# Patient Record
Sex: Male | Born: 1995 | Race: Black or African American | Hispanic: No | Marital: Single | Smoking: Never smoker
Health system: Southern US, Community
[De-identification: ages and names within clinical notes are randomized; demographics above are authoritative.]

---

## 2020-09-03 ENCOUNTER — Emergency Department (HOSPITAL_BASED_OUTPATIENT_CLINIC_OR_DEPARTMENT_OTHER)
Admission: EM | Admit: 2020-09-03 | Discharge: 2020-09-03 | Disposition: A | Discharge status: Home / Self Care | Payer: BLUE CROSS/BLUE SHIELD | Attending: Emergency Medicine | Admitting: Emergency Medicine

## 2020-09-03 ENCOUNTER — Other Ambulatory Visit: Payer: Self-pay

## 2020-09-03 ENCOUNTER — Emergency Department (HOSPITAL_BASED_OUTPATIENT_CLINIC_OR_DEPARTMENT_OTHER): Payer: BLUE CROSS/BLUE SHIELD

## 2020-09-03 ENCOUNTER — Encounter (HOSPITAL_BASED_OUTPATIENT_CLINIC_OR_DEPARTMENT_OTHER): Payer: Self-pay

## 2020-09-03 DIAGNOSIS — Y9367 Activity, basketball: Secondary | ICD-10-CM | POA: Insufficient documentation

## 2020-09-03 DIAGNOSIS — S93402A Sprain of unspecified ligament of left ankle, initial encounter: Secondary | ICD-10-CM | POA: Diagnosis not present

## 2020-09-03 DIAGNOSIS — X501XXA Overexertion from prolonged static or awkward postures, initial encounter: Secondary | ICD-10-CM | POA: Insufficient documentation

## 2020-09-03 DIAGNOSIS — S99912A Unspecified injury of left ankle, initial encounter: Secondary | ICD-10-CM | POA: Diagnosis present

## 2020-09-03 DIAGNOSIS — G8911 Acute pain due to trauma: Secondary | ICD-10-CM

## 2020-09-03 MED ORDER — IBUPROFEN 800 MG PO TABS
800.0000 mg | ORAL_TABLET | Freq: Once | ORAL | Status: AC
  Administered 2020-09-03: 800 mg via ORAL
  Filled 2020-09-03: qty 1

## 2020-09-03 MED ORDER — CYCLOBENZAPRINE HCL 10 MG PO TABS: 10.0000 mg | ORAL_TABLET | Freq: Two times a day (BID) | ORAL | 0 refills | Status: DC | PRN

## 2020-09-03 MED ORDER — IBUPROFEN 600 MG PO TABS: 600.0000 mg | ORAL_TABLET | Freq: Four times a day (QID) | ORAL | 0 refills | Status: DC | PRN

## 2020-09-03 NOTE — ED Provider Notes (Signed)
[facility] EMERGENCY DEPARTMENT Provider Note   CSN: [id number] Arrival date & time: 09/03/20  1934     History Chief Complaint  Patient presents with  . Ankle Pain    Kenneth Mendoza is a 25 y.o. male.  The history is provided by the patient. No language interpreter was used.  Ankle Pain Associated symptoms: no fever      25 year old male presenting for evaluation of foot/ankle injury.  Patient report he was playing basketball earlier today when he landed awkwardly and twisted his left ankle.  He felt a pop.  He was having difficulty bearing weight afterward.  Describes sharp throbbing pain about the base of the ankle worse with movement.  He also noticed swelling.  He denies any knee or hip pain.  Denies any numbness  History reviewed. No pertinent past medical history.  There are no problems to display for this patient.   The histories are not reviewed yet. Please review them in the "History" navigator section and refresh this SmartLink.     History reviewed. No pertinent family history.     Home Medications Prior to Admission medications   Not on File    Allergies    Patient has no known allergies.  Review of Systems   Review of Systems  Constitutional: Negative for fever.  Musculoskeletal: Positive for arthralgias and joint swelling.  Skin: Negative for wound.  Neurological: Negative for numbness.    Physical Exam Updated Vital Signs BP 122/78 (BP Location: Right Arm)   Pulse 76   Temp 98.1 F (36.7 C) (Oral)   Resp 16   Ht 5\' 11"  (1.803 m)   Wt 79.4 kg   SpO2 100%   BMI 24.41 kg/m   Physical Exam Vitals and nursing note reviewed.  Constitutional:      General: He is not in acute distress.    Appearance: He is well-developed.  HENT:     Head: Atraumatic.  Eyes:     Conjunctiva/sclera: Conjunctivae normal.  Musculoskeletal:        General: Swelling (Left ankle: Tenderness to the lateral malleoli region with associated edema.  No  obvious deformity or crepitus noted.  DP pulse palpable.  Brisk cap refill.  No pain at fifth metatarsal region.) present.     Cervical back: Neck supple.     Comments: Left knee and left hip nontender.  Skin:    Findings: No rash.  Neurological:     Mental Status: He is alert.  Psychiatric:        Mood and Affect: Mood normal.     ED Results / Procedures / Treatments   Labs (all labs ordered are listed, but only abnormal results are displayed) Labs Reviewed - No data to display  EKG None  Radiology DG Foot Complete Left  Result Date: 09/03/2020 CLINICAL DATA:  Ankle injury while playing basketball. EXAM: LEFT FOOT - COMPLETE 3+ VIEW COMPARISON:  None. FINDINGS: There is no evidence of fracture or dislocation. There is no evidence of arthropathy or other focal bone abnormality. Soft tissues are unremarkable. IMPRESSION: Negative. Electronically Signed   By: 11/03/2020 M.D.   On: 09/03/2020 20:22    Procedures Procedures   Medications Ordered in ED Medications  ibuprofen (ADVIL) tablet 800 mg (has no administration in time range)    ED Course  I have reviewed the triage vital signs and the nursing notes.  Pertinent labs & imaging results that were available during my care of the patient were  reviewed by me and considered in my medical decision making (see chart for details).    MDM Rules/Calculators/A&P                          BP 122/78 (BP Location: Right Arm)   Pulse 76   Temp 98.1 F (36.7 C) (Oral)   Resp 16   Ht 5\' 11"  (1.803 m)   Wt 79.4 kg   SpO2 100%   BMI 24.41 kg/m   Final Clinical Impression(s) / ED Diagnoses Final diagnoses:  Acute pain due to injury  Sprain of left ankle, unspecified ligament, initial encounter    Rx / DC Orders ED Discharge Orders         Ordered    ibuprofen (ADVIL) 600 MG tablet  Every 6 hours PRN        09/03/20 2107    cyclobenzaprine (FLEXERIL) 10 MG tablet  2 times daily PRN        09/03/20 2107         9:06  PM Patient twisted his left ankle while playing basketball today.  He does have swelling to the lateral malleoli area on his left ankle.  X-ray without any acute fracture or dislocation.  Suspect ankle sprain.  ASO brace provided, rice therapy discussed.  Orthopedic referral given, work note provided.   2108, PA-C 09/03/20 2109    2110, MD 09/04/20 [phone]

## 2020-09-03 NOTE — ED Triage Notes (Signed)
PT states he was playing basketball and injured his left ankle. Ambulated into triage. Took ibuprofen approx 1830

## 2021-04-17 ENCOUNTER — Other Ambulatory Visit: Payer: Self-pay

## 2021-04-17 ENCOUNTER — Emergency Department (HOSPITAL_BASED_OUTPATIENT_CLINIC_OR_DEPARTMENT_OTHER): Payer: BLUE CROSS/BLUE SHIELD

## 2021-04-17 ENCOUNTER — Encounter (HOSPITAL_BASED_OUTPATIENT_CLINIC_OR_DEPARTMENT_OTHER): Payer: Self-pay | Admitting: Emergency Medicine

## 2021-04-17 ENCOUNTER — Emergency Department (HOSPITAL_BASED_OUTPATIENT_CLINIC_OR_DEPARTMENT_OTHER)
Admission: EM | Admit: 2021-04-17 | Discharge: 2021-04-17 | Disposition: A | Discharge status: Home / Self Care | Payer: BLUE CROSS/BLUE SHIELD | Attending: Emergency Medicine | Admitting: Emergency Medicine

## 2021-04-17 DIAGNOSIS — S90129A Contusion of unspecified lesser toe(s) without damage to nail, initial encounter: Secondary | ICD-10-CM

## 2021-04-17 DIAGNOSIS — W2209XA Striking against other stationary object, initial encounter: Secondary | ICD-10-CM | POA: Insufficient documentation

## 2021-04-17 DIAGNOSIS — M79675 Pain in left toe(s): Secondary | ICD-10-CM | POA: Diagnosis present

## 2021-04-17 NOTE — ED Provider Notes (Signed)
°  [facility] EMERGENCY DEPT Provider Note   CSN: [id number] Arrival date & time: 04/17/21  1150     History  Chief Complaint  Patient presents with   Toe Pain    De Jaworski is a 26 y.o. male.  26 year old male presents with 3 weeks of left third toe pain after injuring it at home.  States he struck it against a piece of furniture.  Has had pain characterizes throbbing that is worse with ambulation or standing for prolonged periods of time.  Has been using extra socks for support at home      Home Medications Prior to Admission medications   Medication Sig Start Date End Date Taking? Authorizing Provider  cyclobenzaprine (FLEXERIL) 10 MG tablet Take 1 tablet (10 mg total) by mouth 2 (two) times daily as needed for muscle spasms. 09/03/20   Fayrene Helper, PA-C  ibuprofen (ADVIL) 600 MG tablet Take 1 tablet (600 mg total) by mouth every 6 (six) hours as needed. 09/03/20   Fayrene Helper, PA-C      Allergies    Patient has no known allergies.    Review of Systems   Review of Systems  All other systems reviewed and are negative.  Physical Exam Updated Vital Signs BP (!) 146/66 (BP Location: Right Arm)    Pulse 82    Temp 98.1 F (36.7 C) (Oral)    Resp 20    Ht 1.803 m (5\' 11" )    Wt 74.8 kg    SpO2 99%    BMI 23.01 kg/m  Physical Exam Vitals and nursing note reviewed.  Constitutional:      Appearance: He is well-developed. He is not toxic-appearing.  HENT:     Head: Normocephalic and atraumatic.  Eyes:     Conjunctiva/sclera: Conjunctivae normal.     Pupils: Pupils are equal, round, and reactive to light.  Cardiovascular:     Rate and Rhythm: Normal rate.  Pulmonary:     Effort: Pulmonary effort is normal.  Musculoskeletal:     Cervical back: Normal range of motion.  Feet:     Comments: Left third toe is tender to palpation. Skin:    General: Skin is warm and dry.  Neurological:     Mental Status: He is alert and oriented to person, place, and time.     ED Results / Procedures / Treatments   Labs (all labs ordered are listed, but only abnormal results are displayed) Labs Reviewed - No data to display  EKG None  Radiology No results found.  Procedures Procedures    Medications Ordered in ED Medications - No data to display  ED Course/ Medical Decision Making/ A&P                           Medical Decision Making  X-rays are negative.  Patient will buddy tape his toe will be discharged         Final Clinical Impression(s) / ED Diagnoses Final diagnoses:  None    Rx / DC Orders ED Discharge Orders     None         , MD 04/17/21 1242

## 2021-04-17 NOTE — Discharge Instructions (Signed)
Your x-rays are negative here.

## 2021-04-17 NOTE — ED Triage Notes (Signed)
Left third toe x  3 weeks ,  limping since  sat ,   does remember hitting that toe in the kitchen

## 2021-04-17 NOTE — ED Notes (Signed)
Left third toe buddy taped to left 2nd toe without difficulty.

## 2021-04-20 DIAGNOSIS — U071 COVID-19: Secondary | ICD-10-CM

## 2021-04-20 HISTORY — DX: COVID-19: U07.1

## 2021-04-25 ENCOUNTER — Encounter: Payer: Self-pay | Admitting: Family Medicine

## 2021-04-25 ENCOUNTER — Emergency Department (INDEPENDENT_AMBULATORY_CARE_PROVIDER_SITE_OTHER)
Admission: EM | Admit: 2021-04-25 | Discharge: 2021-04-25 | Disposition: A | Discharge status: Home / Self Care | Payer: BLUE CROSS/BLUE SHIELD | Source: Home / Self Care

## 2021-04-25 ENCOUNTER — Other Ambulatory Visit: Payer: Self-pay

## 2021-04-25 DIAGNOSIS — U071 COVID-19: Secondary | ICD-10-CM

## 2021-04-25 DIAGNOSIS — R059 Cough, unspecified: Secondary | ICD-10-CM

## 2021-04-25 MED ORDER — PREDNISONE 20 MG PO TABS: ORAL_TABLET | ORAL | 0 refills | Status: DC

## 2021-04-25 MED ORDER — BENZONATATE 200 MG PO CAPS: 200.0000 mg | ORAL_CAPSULE | Freq: Three times a day (TID) | ORAL | 0 refills | Status: AC | PRN

## 2021-04-25 NOTE — ED Provider Notes (Signed)
[facility]    CSN: [id number] Arrival date & time: 04/25/21  1829      History   Chief Complaint Chief Complaint  Patient presents with   Covid Positive    HPI Kenneth Mendoza is a 26 y.o. male.   HPI 26 year old male presents with COVID-positive  4 days ago on home test.  Patient reports  Past Medical History:  Diagnosis Date   COVID-19 04/20/2021   no vaccine    There are no problems to display for this patient.   History reviewed. No pertinent surgical history.     Home Medications    Prior to Admission medications   Medication Sig Start Date End Date Taking? Authorizing Provider  benzonatate (TESSALON) 200 MG capsule Take 1 capsule (200 mg total) by mouth 3 (three) times daily as needed for up to 7 days for cough. 04/25/21 05/02/21 Yes Trevor Iha, FNP  predniSONE (DELTASONE) 20 MG tablet Take 3 tabs PO daily x 5 days. 04/25/21  Yes Trevor Iha, FNP  cyclobenzaprine (FLEXERIL) 10 MG tablet Take 1 tablet (10 mg total) by mouth 2 (two) times daily as needed for muscle spasms. Patient not taking: Reported on 04/25/2021 09/03/20   Fayrene Helper, PA-C  ibuprofen (ADVIL) 600 MG tablet Take 1 tablet (600 mg total) by mouth every 6 (six) hours as needed. Patient not taking: Reported on 04/25/2021 09/03/20   Fayrene Helper, PA-C    Family History Family History  Problem Relation Age of Onset   Hypertension Mother     Social History Social History   Tobacco Use   Smoking status: Never    Passive exposure: Never   Smokeless tobacco: Never  Vaping Use   Vaping Use: Never used  Substance Use Topics   Alcohol use: Never   Drug use: Not Currently    Types: Marijuana     Allergies   Patient has no known allergies.   Review of Systems Review of Systems  HENT:  Positive for congestion.   Respiratory:  Positive for cough.   All other systems reviewed and are negative.   Physical Exam Triage Vital Signs ED Triage Vitals  Enc Vitals Group     BP       Pulse      Resp      Temp      Temp src      SpO2      Weight      Height      Head Circumference      Peak Flow      Pain Score      Pain Loc      Pain Edu?      Excl. in GC?    No data found.  Updated Vital Signs BP 131/73 (BP Location: Right Arm)    Pulse 74    Temp 98.8 F (37.1 C) (Oral)    Resp 15    SpO2 98%      Physical Exam Vitals and nursing note reviewed.  Constitutional:      Appearance: Normal appearance. He is normal weight.  HENT:     Head: Normocephalic and atraumatic.     Right Ear: Tympanic membrane, ear canal and external ear normal.     Left Ear: Tympanic membrane, ear canal and external ear normal.     Mouth/Throat:     Mouth: Mucous membranes are moist.     Pharynx: Oropharynx is clear.  Eyes:     Extraocular Movements: Extraocular  movements intact.     Conjunctiva/sclera: Conjunctivae normal.     Pupils: Pupils are equal, round, and reactive to light.  Cardiovascular:     Rate and Rhythm: Normal rate and regular rhythm.     Pulses: Normal pulses.     Heart sounds: Normal heart sounds.  Pulmonary:     Effort: Pulmonary effort is normal.     Breath sounds: Normal breath sounds.  Musculoskeletal:     Cervical back: Normal range of motion and neck supple.  Skin:    General: Skin is warm and dry.  Neurological:     General: No focal deficit present.     Mental Status: He is alert and oriented to person, place, and time.     UC Treatments / Results  Labs (all labs ordered are listed, but only abnormal results are displayed) Labs Reviewed - No data to display  EKG   Radiology No results found.  Procedures Procedures (including critical care time)  Medications Ordered in UC Medications - No data to display  Initial Impression / Assessment and Plan / UC Course  I have reviewed the triage vital signs and the nursing notes.  Pertinent labs & imaging results that were available during my care of the patient were reviewed by me  and considered in my medical decision making (see chart for details).     MDM: 1.  COVID-19-Advised/instructed patient to self quarantine for 10 days or from initial test date of Friday, 04/21/2021-Monday, 05/01/2021.  Advised patient if asymptomatic and afebrile may discontinue self quarantine date 5 days after testing or Wednesday, 04/26/2021.  Advised patient may alternate OTC Tylenol 1000 mg 1-2 times daily, as needed with OTC Ibuprofen 800 mg 1-2 times daily, as needed for fever and myalgias. 2. Cough-Rx'd Prednisone and Tessalon Perles. Advised patient to take medication as directed with food to completion.  Advised patient may use Tessalon Perles daily or as needed for cough.  Patient discharged home, hemodynamically stable. Final Clinical Impressions(s) / UC Diagnoses   Final diagnoses:  COVID-19  Cough, unspecified type     Discharge Instructions      Advised/instructed patient to self quarantine for 10 days or from initial test date of Thursday, 04/20/2021-Sunday, 04/30/2021.  Advised patient if asymptomatic and afebrile may discontinue self quarantine date 5 days after testing or Tuesday, 04/25/2021.  Advised patient may alternate OTC Tylenol 1000 mg 1-2 times daily, as needed with OTC Ibuprofen 800 mg 1-2 times daily, as needed for fever and myalgias.  Advised patient to take medication as directed with food to completion.  Advised patient may use Tessalon Perles daily or as needed for cough.     ED Prescriptions     Medication Sig Dispense Auth. Provider   predniSONE (DELTASONE) 20 MG tablet Take 3 tabs PO daily x 5 days. 15 tablet Trevor Iha, FNP   benzonatate (TESSALON) 200 MG capsule Take 1 capsule (200 mg total) by mouth 3 (three) times daily as needed for up to 7 days for cough. 40 capsule Trevor Iha, FNP      PDMP not reviewed this encounter.   Trevor Iha, FNP 04/25/21 1918

## 2021-04-25 NOTE — ED Triage Notes (Signed)
COVID positive  Symptoms started last Thursday  Pt continues to have body aches & cough  No OTC meds

## 2021-04-25 NOTE — Discharge Instructions (Addendum)
Advised/instructed patient to self quarantine for 10 days or from initial test date of Thursday, 04/20/2021-Sunday, 04/30/2021.  Advised patient if asymptomatic and afebrile may discontinue self quarantine date 5 days after testing or Tuesday, 04/25/2021.  Advised patient may alternate OTC Tylenol 1000 mg 1-2 times daily, as needed with OTC Ibuprofen 800 mg 1-2 times daily, as needed for fever and myalgias.  Advised patient to take medication as directed with food to completion.  Advised patient may use Tessalon Perles daily or as needed for cough.

## 2021-11-21 ENCOUNTER — Emergency Department (HOSPITAL_BASED_OUTPATIENT_CLINIC_OR_DEPARTMENT_OTHER): Payer: BLUE CROSS/BLUE SHIELD | Admitting: Radiology

## 2021-11-21 ENCOUNTER — Other Ambulatory Visit: Payer: Self-pay

## 2021-11-21 ENCOUNTER — Encounter (HOSPITAL_BASED_OUTPATIENT_CLINIC_OR_DEPARTMENT_OTHER): Payer: Self-pay | Admitting: Pharmacy Technician

## 2021-11-21 ENCOUNTER — Emergency Department (HOSPITAL_BASED_OUTPATIENT_CLINIC_OR_DEPARTMENT_OTHER)
Admission: EM | Admit: 2021-11-21 | Discharge: 2021-11-21 | Disposition: A | Discharge status: Home / Self Care | Payer: BLUE CROSS/BLUE SHIELD | Attending: Emergency Medicine | Admitting: Emergency Medicine

## 2021-11-21 ENCOUNTER — Other Ambulatory Visit (HOSPITAL_BASED_OUTPATIENT_CLINIC_OR_DEPARTMENT_OTHER): Payer: Self-pay

## 2021-11-21 DIAGNOSIS — R0789 Other chest pain: Secondary | ICD-10-CM | POA: Diagnosis not present

## 2021-11-21 DIAGNOSIS — F419 Anxiety disorder, unspecified: Secondary | ICD-10-CM | POA: Diagnosis not present

## 2021-11-21 DIAGNOSIS — Z8616 Personal history of COVID-19: Secondary | ICD-10-CM | POA: Insufficient documentation

## 2021-11-21 DIAGNOSIS — R079 Chest pain, unspecified: Secondary | ICD-10-CM

## 2021-11-21 LAB — CBC
HCT: 43.1 % (ref 39.0–52.0)
Hemoglobin: 14.5 g/dL (ref 13.0–17.0)
MCH: 30 pg (ref 26.0–34.0)
MCHC: 33.6 g/dL (ref 30.0–36.0)
MCV: 89.2 fL (ref 80.0–100.0)
Platelets: 214 10*3/uL (ref 150–400)
RBC: 4.83 MIL/uL (ref 4.22–5.81)
RDW: 12.4 % (ref 11.5–15.5)
WBC: 6 10*3/uL (ref 4.0–10.5)
nRBC: 0 % (ref 0.0–0.2)

## 2021-11-21 LAB — BASIC METABOLIC PANEL
Anion gap: 12 (ref 5–15)
BUN: 17 mg/dL (ref 6–20)
CO2: 23 mmol/L (ref 22–32)
Calcium: 10 mg/dL (ref 8.9–10.3)
Chloride: 100 mmol/L (ref 98–111)
Creatinine, Ser: 1.14 mg/dL (ref 0.61–1.24)
GFR, Estimated: >60 mL/min (ref >60)
Glucose, Bld: 79 mg/dL (ref 70–99)
Potassium: 3.9 mmol/L (ref 3.5–5.1)
Sodium: 135 mmol/L (ref 135–145)

## 2021-11-21 LAB — TROPONIN I (HIGH SENSITIVITY): Troponin I (High Sensitivity): 7 ng/L (ref <18)

## 2021-11-21 MED ORDER — HYDROXYZINE HCL 25 MG PO TABS
25.0000 mg | ORAL_TABLET | Freq: Three times a day (TID) | ORAL | 0 refills | Status: DC | PRN
  Filled 2021-11-21: qty 8, 3d supply, fill #0

## 2021-11-21 NOTE — ED Triage Notes (Signed)
Pt here with reports of chest pain X2 days. L sided, no radiation. States today he had a panic attack as well. Denies shob, nausea or diaphoresis.

## 2021-11-21 NOTE — ED Provider Notes (Signed)
[facility] EMERGENCY DEPT Provider Note   CSN: [id number] Arrival date & time: 11/21/21  1112     History  No chief complaint on file.   Kenneth Mendoza is a 26 y.o. male.  HPI Patient presents with chest pain and palpitations.  Began today acutely while at work.  States began to feel anxious.  Has had anxiety with a 2 and feels he could have had a panic attack.  States he was not short of breath but did feel as chest hurting on the left side.  Feeling somewhat better now.  No family history of early cardiac disease.  Denies drug use.  No fevers or chills.  No coughing.   Past Medical History:  Diagnosis Date   COVID-19 04/20/2021   no vaccine    Home Medications Prior to Admission medications   Medication Sig Start Date End Date Taking? Authorizing Provider  hydrOXYzine (ATARAX) 25 MG tablet Take 1 tablet (25 mg total) by mouth every 8 (eight) hours as needed for anxiety. 11/21/21  Yes Benjiman Core, MD  cyclobenzaprine (FLEXERIL) 10 MG tablet Take 1 tablet (10 mg total) by mouth 2 (two) times daily as needed for muscle spasms. Patient not taking: Reported on 04/25/2021 09/03/20   Fayrene Helper, PA-C  ibuprofen (ADVIL) 600 MG tablet Take 1 tablet (600 mg total) by mouth every 6 (six) hours as needed. Patient not taking: Reported on 04/25/2021 09/03/20   Fayrene Helper, PA-C  predniSONE (DELTASONE) 20 MG tablet Take 3 tabs PO daily x 5 days. 04/25/21   Trevor Iha, FNP      Allergies    Patient has no known allergies.    Review of Systems   Review of Systems  Physical Exam Updated Vital Signs BP 136/71   Pulse 74   Temp 98.3 F (36.8 C)   Resp 17   SpO2 99%  Physical Exam Vitals and nursing note reviewed.  HENT:     Head: Normocephalic.  Eyes:     Pupils: Pupils are equal, round, and reactive to light.  Cardiovascular:     Rate and Rhythm: Regular rhythm.  Pulmonary:     Comments: Left anterior upper chest wall tenderness. Chest:     Chest wall:  Tenderness present.  Abdominal:     Tenderness: There is no abdominal tenderness.  Musculoskeletal:        General: No tenderness.  Skin:    Capillary Refill: Capillary refill takes less than 2 seconds.  Neurological:     Mental Status: He is alert and oriented to person, place, and time.     ED Results / Procedures / Treatments   Labs (all labs ordered are listed, but only abnormal results are displayed) Labs Reviewed  BASIC METABOLIC PANEL  CBC  TROPONIN I (HIGH SENSITIVITY)  TROPONIN I (HIGH SENSITIVITY)    EKG EKG Interpretation  Date/Time:  Tuesday November 21 2021 11:23:43 EDT Ventricular Rate:  76 PR Interval:  158 QRS Duration: 82 QT Interval:  360 QTC Calculation: 405 R Axis:   73 Text Interpretation: Normal sinus rhythm Normal ECG No previous ECGs available Confirmed by Benjiman Core [phone]) on 11/21/2021 1:13:13 PM  Radiology DG Chest 2 View  Result Date: 11/21/2021 CLINICAL DATA:  Chest pain, shortness of breath EXAM: CHEST - 2 VIEW COMPARISON:  None Available. FINDINGS: The heart size and mediastinal contours are within normal limits. Both lungs are clear. The visualized skeletal structures are unremarkable. IMPRESSION: Normal chest radiographs. Electronically Signed   By: Janyth Pupa  Plundo D.O.   On: 11/21/2021 11:46    Procedures Procedures    Medications Ordered in ED Medications - No data to display  ED Course/ Medical Decision Making/ A&P                           Medical Decision Making Amount and/or Complexity of Data Reviewed Labs: ordered. Radiology: ordered.  Risk Prescription drug management.   Patient feels with left-sided chest pain and some anxiety.  Has had chest pain for the last couple days.  No fevers.  No cough.  No shortness of breath.  Differential diagnosis includes coronary artery disease, pneumonia, pulmonary embolism, anxiety. Chest wall pain also considered and is reproducible left-sided upper chest wall.  EKG  reassuring and troponin negative.  Chest x-ray also shows no acute cause of the pain.  Doubt severe cause at this time.  Potentially could have had a arrhythmia that resolved but EKG reassuring.  Appears stable for discharge home.  Likely does have a component anxiety and will give prescription for Vistaril.  Outpatient follow-up as needed.  Will discharge.        Final Clinical Impression(s) / ED Diagnoses Final diagnoses:  Nonspecific chest pain  Anxiety    Rx / DC Orders ED Discharge Orders          Ordered    hydrOXYzine (ATARAX) 25 MG tablet  Every 8 hours PRN        11/21/21 1320              Benjiman Core, MD 11/21/21 1324

## 2021-12-05 ENCOUNTER — Other Ambulatory Visit (HOSPITAL_BASED_OUTPATIENT_CLINIC_OR_DEPARTMENT_OTHER): Payer: Self-pay

## 2021-12-28 ENCOUNTER — Ambulatory Visit (INDEPENDENT_AMBULATORY_CARE_PROVIDER_SITE_OTHER): Payer: BLUE CROSS/BLUE SHIELD | Admitting: Nurse Practitioner

## 2021-12-28 ENCOUNTER — Encounter (HOSPITAL_BASED_OUTPATIENT_CLINIC_OR_DEPARTMENT_OTHER): Payer: Self-pay | Admitting: Nurse Practitioner

## 2021-12-28 VITALS — BP 113/90 | HR 77 | Ht 72.0 in | Wt 162.0 lb

## 2021-12-28 DIAGNOSIS — F411 Generalized anxiety disorder: Secondary | ICD-10-CM

## 2021-12-28 DIAGNOSIS — Z0000 Encounter for general adult medical examination without abnormal findings: Secondary | ICD-10-CM

## 2021-12-28 MED ORDER — HYDROXYZINE HCL 25 MG PO TABS: 25.0000 mg | ORAL_TABLET | Freq: Three times a day (TID) | ORAL | 6 refills | Status: AC | PRN

## 2021-12-28 NOTE — Assessment & Plan Note (Signed)
New onset of anxiety well controlled at this time. Recent panic attack, cleared by ED. Hydroxyzine helpful for periods of stress, will refill today. Recommend continue with deep breathing, relaxation, and walks to help. F/U if symptoms worsen

## 2021-12-28 NOTE — Assessment & Plan Note (Signed)

## 2021-12-28 NOTE — Patient Instructions (Addendum)
Thank you for choosing [employer] at [facility] for your Primary Care needs. I am excited for the opportunity to partner with you to meet your health care goals. It was a pleasure meeting you today!  Recommendations from today's visit: Remember: Welda! :-) We will plan to get a few labs today for baseline. I will be in touch with you once I have reviewed these. You will see them immediately, but it may take me a few days to get them and review them.  Your exam looked great today!  If you feel like your anxiety is worsening or getting harder to control, please let me know.   Information on diet, exercise, and health maintenance recommendations are listed below. This is information to help you be sure you are on track for optimal health and monitoring.   Please look over this and let us know if you have any questions or if you have completed any of the health maintenance outside of [place] so that we can be sure your records are up to date.  ___________________________________________________________ About Me: I am an Adult-Geriatric Nurse Practitioner with a background in caring for patients for more than 20 years with a strong intensive care background. I provide primary care and sports medicine services to patients age 50 and older within this office. My education had a strong focus on caring for the older adult population, which I am passionate about. I am also the director of the APP Fellowship with [facility].   My desire is to provide you with the best service through preventive medicine and supportive care. I consider you a part of the medical team and value your input. I work diligently to ensure that you are heard and your needs are met in a safe and effective manner. I want you to feel comfortable with me as your provider and want you to know that your health concerns are important to me.  For your information, our office hours are: Monday,  Tuesday, and Thursday 8:00 AM - 5:00 PM Wednesday and Friday 8:00 AM - 12:00 PM.   In my time away from the office I am teaching new APP's within the system and am unavailable, but my partner, Dr. Burnard Bunting is in the office for emergent needs.   If you have questions or concerns, please call our office at [phone] or send [place] a MyChart message and we will respond as quickly as possible.  ____________________________________________________________ MyChart:  For all urgent or time sensitive needs we ask that you please call the office to avoid delays. Our number is [phone]. MyChart is not constantly monitored and due to the large volume of messages a day, replies may take up to 72 business hours.  MyChart Policy: MyChart allows for you to see your visit notes, after visit summary, provider recommendations, lab and tests results, make an appointment, request refills, and contact your provider or the office for non-urgent questions or concerns. Providers are seeing patients during normal business hours and do not have built in time to review MyChart messages.  We ask that you allow a minimum of 3 business days for responses to [employer]. For this reason, please do not send urgent requests through [place]. Please call the office at [phone]. New and ongoing conditions may require a visit. We have virtual and in person visit available for your convenience.  Complex MyChart concerns may require a visit. Your provider may request you schedule a virtual or  in person visit to ensure we are providing the best care possible. MyChart messages sent after 11:00 AM on Friday will not be received by the provider until Monday morning.    Lab and Test Results: You will receive your lab and test results on MyChart as soon as they are completed and results have been sent by the lab or testing facility. Due to this service, you will receive your results BEFORE your provider.  I review lab and tests  results each morning prior to seeing patients. Some results require collaboration with other providers to ensure you are receiving the most appropriate care. For this reason, we ask that you please allow a minimum of 3-5 business days from the time the ALL results have been received for your provider to receive and review lab and test results and contact you about these.  Most lab and test result comments from the provider will be sent through [place]. Your provider may recommend changes to the plan of care, follow-up visits, repeat testing, ask questions, or request an office visit to discuss these results. You may reply directly to this message or call the office at [phone] to provide information for the provider or set up an appointment. In some instances, you will be called with test results and recommendations. Please let us know if this is preferred and we will make note of this in your chart to provide this for you.    If you have not heard a response to your lab or test results in 5 business days from all results returning to [place], please call the office to let us know. We ask that you please avoid calling prior to this time unless there is an emergent concern. Due to high call volumes, this can delay the resulting process.  After Hours: For all non-emergency after hours needs, please call the office at [phone] and select the option to reach the on-call provider service. On-call services are shared between multiple [place] offices and therefore it will not be possible to speak directly with your provider. On-call providers may provide medical advice and recommendations, but are unable to provide refills for maintenance medications.  For all emergency or urgent medical needs after normal business hours, we recommend that you seek care at the closest Urgent Care or Emergency Department to ensure appropriate treatment in a timely manner.  [facility] [place] has a 24 hour  emergency room located on the ground floor for your convenience.   Urgent Concerns During the Business Day Providers are seeing patients from 8AM to [place] with a busy schedule and are most often not able to respond to non-urgent calls until the end of the day or the next business day. If you should have URGENT concerns during the day, please call and speak to the nurse or schedule a same day appointment so that we can address your concern without delay.   Thank you, again, for choosing me as your health care partner. I appreciate your trust and look forward to learning more about you.   Worthy Keeler, DNP, AGNP-c ___________________________________________________________  Health Maintenance Recommendations Screening Testing Mammogram Every 1 -2 years based on history and risk factors Starting at age 71 Pap Smear Ages 21-39 every 3 years Ages 5-65 every 5 years with HPV testing More frequent testing may be required based on results and history Colon Cancer Screening Every 1-10 years based on test performed, risk factors, and history Starting at age 88 Bone Density Screening Every 2-10  years based on history Starting at age 50 for women Recommendations for men differ based on medication usage, history, and risk factors AAA Screening One time ultrasound Men 36-18 years old who have every smoked Lung Cancer Screening Low Dose Lung CT every 12 months Age 6-80 years with a 30 pack-year smoking history who still smoke or who have quit within the last 15 years  Screening Labs Routine  Labs: Complete Blood Count (CBC), Complete Metabolic Panel (CMP), Cholesterol (Lipid Panel) Every 6-12 months based on history and medications May be recommended more frequently based on current conditions or previous results Hemoglobin A1c Lab Every 3-12 months based on history and previous results Starting at age 43 or earlier with diagnosis of diabetes, high cholesterol, BMI >26, and/or risk  factors Frequent monitoring for patients with diabetes to ensure blood sugar control Thyroid Panel (TSH w/ T3 & T4) Every 6 months based on history, symptoms, and risk factors May be repeated more often if on medication HIV One time testing for all patients 94 and older May be repeated more frequently for patients with increased risk factors or exposure Hepatitis C One time testing for all patients 4 and older May be repeated more frequently for patients with increased risk factors or exposure Gonorrhea, Chlamydia Every 12 months for all sexually active persons 13-24 years Additional monitoring may be recommended for those who are considered high risk or who have symptoms PSA Men 7-66 years old with risk factors Additional screening may be recommended from age 27-69 based on risk factors, symptoms, and history  Vaccine Recommendations Tetanus Booster All adults every 10 years Flu Vaccine All patients 6 months and older every year COVID Vaccine All patients 12 years and older Initial dosing with booster May recommend additional booster based on age and health history HPV Vaccine 2 doses all patients age 23-26 Dosing may be considered for patients over 26 Shingles Vaccine (Shingrix) 2 doses all adults 36 years and older Pneumonia (Pneumovax 23) All adults 24 years and older May recommend earlier dosing based on health history Pneumonia (Prevnar 65) All adults 28 years and older Dosed 1 year after Pneumovax 23  Additional Screening, Testing, and Vaccinations may be recommended on an individualized basis based on family history, health history, risk factors, and/or exposure.  __________________________________________________________  Diet Recommendations for All Patients  I recommend that all patients maintain a diet low in saturated fats, carbohydrates, and cholesterol. While this can be challenging at first, it is not impossible and small changes can make big differences.   Things to try: Decreasing the amount of soda, sweet tea, and/or juice to one or less per day and replace with water While water is always the first choice, if you do not like water you may consider adding a water additive without sugar to improve the taste other sugar free drinks Replace potatoes with a brightly colored vegetable at dinner Use healthy oils, such as canola oil or olive oil, instead of butter or hard margarine Limit your bread intake to two pieces or less a day Replace regular pasta with low carb pasta options Bake, broil, or grill foods instead of frying Monitor portion sizes  Eat smaller, more frequent meals throughout the day instead of large meals  An important thing to remember is, if you love foods that are not great for your health, you don't have to give them up completely. Instead, allow these foods to be a reward when you have done well. Allowing yourself to still have special  treats every once in a while is a nice way to tell yourself thank you for working hard to keep yourself healthy.   Also remember that every day is a new day. If you have a bad day and "fall off the wagon", you can still climb right back up and keep moving along on your journey!  We have resources available to help you!  Some websites that may be helpful include: www.http://carter.biz/  Www.VeryWellFit.com _____________________________________________________________  Activity Recommendations for All Patients  I recommend that all adults get at least 20 minutes of moderate physical activity that elevates your heart rate at least 5 days out of the week.  Some examples include: Walking or jogging at a pace that allows you to carry on a conversation Cycling (stationary bike or outdoors) Water aerobics Yoga Weight lifting Dancing If physical limitations prevent you from putting stress on your joints, exercise in a pool or seated in a chair are excellent options.  Do determine your MAXIMUM heart  rate for activity: YOUR AGE - 220 = MAX HeartRate   Remember! Do not push yourself too hard.  Start slowly and build up your pace, speed, weight, time in exercise, etc.  Allow your body to rest between exercise and get good sleep. You will need more water than normal when you are exerting yourself. Do not wait until you are thirsty to drink. Drink with a purpose of getting in at least 8, 8 ounce glasses of water a day plus more depending on how much you exercise and sweat.    If you begin to develop dizziness, chest pain, abdominal pain, jaw pain, shortness of breath, headache, vision changes, lightheadedness, or other concerning symptoms, stop the activity and allow your body to rest. If your symptoms are severe, seek emergency evaluation immediately. If your symptoms are concerning, but not severe, please let us know so that we can recommend further evaluation.

## 2021-12-28 NOTE — Progress Notes (Signed)
Kenneth Render, DNP, AGNP-c Primary Care & Sports Medicine [address]  [place], [place] [address] [phone]  New patient visit   Patient: Kenneth Mendoza   DOB: 1995/06/15   26 y.o. Male  MRN: [id number] Visit Date: 12/28/2021  Patient Care Team: Patient, No Pcp Per as PCP - General (General Practice)  Today's Vitals   12/28/21 0820  BP: (!) 113/90  Pulse: 77  SpO2: 99%  Weight: 162 lb (73.5 kg)  Height: 6' (1.829 m)   Body mass index is 21.97 kg/m.   Today's healthcare provider: Orma Render, NP   Chief Complaint  Patient presents with   New Patient (Initial Visit)    Patient presents today to establish care.    Subjective    Kenneth Mendoza is a 26 y.o. male who presents today as a new patient to establish care.    Patient endorses the following concerns presently: No Concerns- he would like a check-up  Physical: basketball and walking at work Works at [employer])  Diet: Low sugar diet, drinking more water, keeping red meat intake low Sexually active with 1 partner. No concerns about STI- testing declined.   Anxiety - seen in ED recently for CP- all tests negative - determined to be panic attack - feels anxious about general things such as bills, work, etc - has been using hydroxyzine and that helped. - also taking walks and working on relaxation techniques, which are also helping.   No ShOB, CP, Dizziness, HA, hearing/vision changes, abdominal pain, bowel/bladder changes, LE edema, fullness in throat  History reviewed and reveals the following: Past Medical History:  Diagnosis Date   COVID-19 04/20/2021   no vaccine   History reviewed. No pertinent surgical history. Family Status  Relation Name Status   Mother  Alive   Father  Alive   Family History  Problem Relation Age of Onset   Hypertension Mother    Social History   Socioeconomic History   Marital status: Single    Spouse name: Not on file    Number of children: Not on file   Years of education: Not on file   Highest education level: Not on file  Occupational History   Not on file  Tobacco Use   Smoking status: Never    Passive exposure: Never   Smokeless tobacco: Never  Vaping Use   Vaping Use: Never used  Substance and Sexual Activity   Alcohol use: Never   Drug use: Not Currently    Types: Marijuana   Sexual activity: Not on file  Other Topics Concern   Not on file  Social History Narrative   Not on file   Social Determinants of Health   Financial Resource Strain: Not on file  Food Insecurity: Not on file  Transportation Needs: Not on file  Physical Activity: Not on file  Stress: Not on file  Social Connections: Not on file   Outpatient Medications Prior to Visit  Medication Sig   [DISCONTINUED] cyclobenzaprine (FLEXERIL) 10 MG tablet Take 1 tablet (10 mg total) by mouth 2 (two) times daily as needed for muscle spasms. (Patient not taking: Reported on 04/25/2021)   [DISCONTINUED] hydrOXYzine (ATARAX) 25 MG tablet Take 1 tablet (25 mg total) by mouth every 8 (eight) hours as needed for anxiety. (Patient not taking: Reported on 12/28/2021)   [DISCONTINUED] ibuprofen (ADVIL) 600 MG tablet Take 1 tablet (600 mg total) by mouth every 6 (six) hours as needed. (Patient not taking:  Reported on 04/25/2021)   [DISCONTINUED] predniSONE (DELTASONE) 20 MG tablet Take 3 tabs PO daily x 5 days. (Patient not taking: Reported on 12/28/2021)   No facility-administered medications prior to visit.   No Known Allergies  There is no immunization history on file for this patient.  Review of Systems All review of systems negative except what is listed in the HPI   Objective    BP (!) 113/90   Pulse 77   Ht 6' (1.829 m)   Wt 162 lb (73.5 kg)   SpO2 99%   BMI 21.97 kg/m  Physical Exam Vitals and nursing note reviewed.  Constitutional:      General: He is not in acute distress.    Appearance: Normal appearance.  HENT:      Head: Normocephalic and atraumatic.     Right Ear: Hearing, tympanic membrane, ear canal and external ear normal.     Left Ear: Hearing, tympanic membrane, ear canal and external ear normal.     Nose: Nose normal.     Right Sinus: No maxillary sinus tenderness or frontal sinus tenderness.     Left Sinus: No maxillary sinus tenderness or frontal sinus tenderness.     Mouth/Throat:     Lips: Pink.     Mouth: Mucous membranes are moist.     Pharynx: Oropharynx is clear.  Eyes:     General: Lids are normal. Vision grossly intact.     Extraocular Movements: Extraocular movements intact.     Conjunctiva/sclera: Conjunctivae normal.     Pupils: Pupils are equal, round, and reactive to light.     Funduscopic exam:    Right eye: No hemorrhage. Red reflex present.        Left eye: No hemorrhage. Red reflex present.    Visual Fields: Right eye visual fields normal and left eye visual fields normal.  Neck:     Thyroid: No thyromegaly.     Vascular: No carotid bruit or JVD.  Cardiovascular:     Rate and Rhythm: Normal rate and regular rhythm.     Chest Wall: PMI is not displaced.     Pulses: Normal pulses.          Dorsalis pedis pulses are 2+ on the right side and 2+ on the left side.       Posterior tibial pulses are 2+ on the right side and 2+ on the left side.     Heart sounds: Normal heart sounds. No murmur heard. Pulmonary:     Effort: Pulmonary effort is normal. No respiratory distress.     Breath sounds: Normal breath sounds.  Chest:  Breasts:    Breasts are symmetrical.  Abdominal:     General: Bowel sounds are normal. There is no distension or abdominal bruit.     Palpations: Abdomen is soft. There is no hepatomegaly, splenomegaly or mass.     Tenderness: There is no abdominal tenderness. There is no right CVA tenderness, left CVA tenderness, guarding or rebound.  Musculoskeletal:        General: Normal range of motion.     Cervical back: Full passive range of motion  without pain and neck supple. No tenderness. No spinous process tenderness or muscular tenderness.     Right lower leg: No edema.     Left lower leg: No edema.  Feet:     Right foot:     Toenail Condition: Right toenails are normal.     Left foot:  Toenail Condition: Left toenails are normal.  Lymphadenopathy:     Cervical: No cervical adenopathy.     Upper Body:     Right upper body: No supraclavicular adenopathy.     Left upper body: No supraclavicular adenopathy.  Skin:    General: Skin is warm and dry.     Capillary Refill: Capillary refill takes less than 2 seconds.     Nails: There is no clubbing.  Neurological:     General: No focal deficit present.     Mental Status: He is alert and oriented to person, place, and time.     Cranial Nerves: No cranial nerve deficit.     Sensory: Sensation is intact. No sensory deficit.     Motor: Motor function is intact. No weakness.     Coordination: Coordination is intact. Coordination normal.     Gait: Gait is intact. Gait normal.  Psychiatric:        Attention and Perception: Attention normal.        Mood and Affect: Mood normal.        Speech: Speech normal.        Behavior: Behavior normal. Behavior is cooperative.        Thought Content: Thought content normal.        Cognition and Memory: Cognition and memory normal.        Judgment: Judgment normal.     No results found for any visits on 12/28/21.  Assessment & Plan      Problem List Items Addressed This Visit     Encounter for medical examination to establish care - Primary    CPE today with no abnormalities noted on exam.  Labs pending. Will make changes as necessary based on results.  Review of HM activities and recommendations discussed and provided on AVS Anticipatory guidance, diet, and exercise recommendations provided.  Medications, allergies, and hx reviewed and updated as necessary.  Plan to f/u with CPE in 1 year or sooner for acute/chronic health needs as  directed.        Generalized anxiety disorder    New onset of anxiety well controlled at this time. Recent panic attack, cleared by ED. Hydroxyzine helpful for periods of stress, will refill today. Recommend continue with deep breathing, relaxation, and walks to help. F/U if symptoms worsen       Relevant Medications   hydrOXYzine (ATARAX) 25 MG tablet   Other Visit Diagnoses     Health care maintenance       Relevant Orders   CBC With Diff/Platelet   Comprehensive metabolic panel   Hemoglobin A1c   Lipid panel        Return in about 1 year (around 12/29/2022) for CPE today- CPE in 1 year.      Gunda Maqueda, Coralee Pesa, NP, DNP, AGNP-C Primary Care & Sports Medicine at [place] Maintenance Due Health Maintenance Topics with due status: Overdue     Topic Date Due   COVID-19 Vaccine Never done   HPV VACCINES Never done   TETANUS/TDAP Never done    CPE Due  Labs Due

## 2021-12-29 LAB — HEMOGLOBIN A1C
Est. average glucose Bld gHb Est-mCnc: 117 mg/dL
Hgb A1c MFr Bld: 5.7 % — ABNORMAL HIGH (ref 4.8–5.6)

## 2021-12-29 LAB — LIPID PANEL
Chol/HDL Ratio: 3.1 ratio (ref 0.0–5.0)
Cholesterol, Total: 173 mg/dL (ref 100–199)
HDL: 56 mg/dL (ref >39)
LDL Chol Calc (NIH): 106 mg/dL — ABNORMAL HIGH (ref 0–99)
Triglycerides: 57 mg/dL (ref 0–149)
VLDL Cholesterol Cal: 11 mg/dL (ref 5–40)

## 2021-12-29 LAB — CBC WITH DIFF/PLATELET
Basophils Absolute: 0 10*3/uL (ref 0.0–0.2)
Basos: 1 %
EOS (ABSOLUTE): 0.1 10*3/uL (ref 0.0–0.4)
Eos: 2 %
Hematocrit: 42.8 % (ref 37.5–51.0)
Hemoglobin: 15.1 g/dL (ref 13.0–17.7)
Immature Grans (Abs): 0 10*3/uL (ref 0.0–0.1)
Immature Granulocytes: 0 %
Lymphocytes Absolute: 1.7 10*3/uL (ref 0.7–3.1)
Lymphs: 42 %
MCH: 30.4 pg (ref 26.6–33.0)
MCHC: 35.3 g/dL (ref 31.5–35.7)
MCV: 86 fL (ref 79–97)
Monocytes Absolute: 0.4 10*3/uL (ref 0.1–0.9)
Monocytes: 10 %
Neutrophils Absolute: 1.9 10*3/uL (ref 1.4–7.0)
Neutrophils: 45 %
Platelets: 236 10*3/uL (ref 150–450)
RBC: 4.96 x10E6/uL (ref 4.14–5.80)
RDW: 11.5 % — ABNORMAL LOW (ref 11.6–15.4)
WBC: 4.1 10*3/uL (ref 3.4–10.8)

## 2021-12-29 LAB — COMPREHENSIVE METABOLIC PANEL
ALT: 10 IU/L (ref 0–44)
AST: 15 IU/L (ref 0–40)
Albumin/Globulin Ratio: 1.8 (ref 1.2–2.2)
Albumin: 5 g/dL (ref 4.3–5.2)
Alkaline Phosphatase: 56 IU/L (ref 44–121)
BUN/Creatinine Ratio: 13 (ref 9–20)
BUN: 14 mg/dL (ref 6–20)
Bilirubin Total: 0.6 mg/dL (ref 0.0–1.2)
CO2: 23 mmol/L (ref 20–29)
Calcium: 10.2 mg/dL (ref 8.7–10.2)
Chloride: 99 mmol/L (ref 96–106)
Creatinine, Ser: 1.07 mg/dL (ref 0.76–1.27)
Globulin, Total: 2.8 g/dL (ref 1.5–4.5)
Glucose: 80 mg/dL (ref 70–99)
Potassium: 4.6 mmol/L (ref 3.5–5.2)
Sodium: 138 mmol/L (ref 134–144)
Total Protein: 7.8 g/dL (ref 6.0–8.5)
eGFR: 99 mL/min/{1.73_m2} (ref >59)

## 2022-03-03 ENCOUNTER — Encounter (HOSPITAL_BASED_OUTPATIENT_CLINIC_OR_DEPARTMENT_OTHER): Payer: Self-pay | Admitting: *Deleted

## 2022-03-03 ENCOUNTER — Emergency Department (HOSPITAL_BASED_OUTPATIENT_CLINIC_OR_DEPARTMENT_OTHER)
Admission: EM | Admit: 2022-03-03 | Discharge: 2022-03-03 | Disposition: A | Discharge status: Home / Self Care | Payer: BLUE CROSS/BLUE SHIELD | Attending: Emergency Medicine | Admitting: Emergency Medicine

## 2022-03-03 ENCOUNTER — Other Ambulatory Visit: Payer: Self-pay

## 2022-03-03 DIAGNOSIS — M545 Low back pain, unspecified: Secondary | ICD-10-CM | POA: Diagnosis present

## 2022-03-03 MED ORDER — ACETAMINOPHEN 500 MG PO TABS
1000.0000 mg | ORAL_TABLET | Freq: Once | ORAL | Status: AC
  Administered 2022-03-03: 1000 mg via ORAL
  Filled 2022-03-03: qty 2

## 2022-03-03 MED ORDER — OXYCODONE HCL 5 MG PO TABS
5.0000 mg | ORAL_TABLET | Freq: Once | ORAL | Status: AC
  Administered 2022-03-03: 5 mg via ORAL
  Filled 2022-03-03: qty 1

## 2022-03-03 MED ORDER — DIAZEPAM 5 MG PO TABS
5.0000 mg | ORAL_TABLET | Freq: Once | ORAL | Status: AC
  Administered 2022-03-03: 5 mg via ORAL
  Filled 2022-03-03: qty 1

## 2022-03-03 MED ORDER — KETOROLAC TROMETHAMINE 15 MG/ML IJ SOLN
15.0000 mg | Freq: Once | INTRAMUSCULAR | Status: AC
  Administered 2022-03-03: 15 mg via INTRAMUSCULAR
  Filled 2022-03-03: qty 1

## 2022-03-03 NOTE — ED Provider Notes (Signed)
[facility] EMERGENCY DEPT Provider Note   CSN: [id number] Arrival date & time: 03/03/22  1903     History  Chief Complaint  Patient presents with   Back Pain    Kenneth Mendoza is a 26 y.o. male.  26 yo M with a chief complaints of low back pain.  Mostly on the left side.  Going on for a few days now.  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  He has been getting some physical therapy treatments his employer with some improvement off and on but has not been able to fully rested.  He feels like the pain has been getting a little bit worse and came to be evaluated.  He denies loss of bowel or bladder denies loss Pyrtle sensation denies numbness or weakness to the leg denies IV drug abuse denies history of cancer denies trauma.   Back Pain      Home Medications Prior to Admission medications   Medication Sig Start Date End Date Taking? Authorizing Provider  hydrOXYzine (ATARAX) 25 MG tablet Take 1 tablet (25 mg total) by mouth every 8 (eight) hours as needed. 12/28/21   Tollie Eth, NP      Allergies    Patient has no known allergies.    Review of Systems   Review of Systems  Musculoskeletal:  Positive for back pain.    Physical Exam Updated Vital Signs BP (!) 145/80 (BP Location: Right Arm)   Pulse (!) 51   Temp 97.9 F (36.6 C)   Resp 18   SpO2 99%  Physical Exam Vitals and nursing note reviewed.  Constitutional:      Appearance: He is well-developed.  HENT:     Head: Normocephalic and atraumatic.  Eyes:     Pupils: Pupils are equal, round, and reactive to light.  Neck:     Vascular: No JVD.  Cardiovascular:     Rate and Rhythm: Normal rate and regular rhythm.     Heart sounds: No murmur heard.    No friction rub. No gallop.  Pulmonary:     Effort: No respiratory distress.     Breath sounds: No wheezing.  Abdominal:     General: There is no distension.     Tenderness: There is no  abdominal tenderness. There is no guarding or rebound.  Musculoskeletal:        General: Normal range of motion.     Cervical back: Normal range of motion and neck supple.     Comments: PMS intact to the left lower extremity.  Reflexes 2+ and equal.  No clonus.  Skin:    Coloration: Skin is not pale.     Findings: No rash.  Neurological:     Mental Status: He is alert and oriented to person, place, and time.  Psychiatric:        Behavior: Behavior normal.     ED Results / Procedures / Treatments   Labs (all labs ordered are listed, but only abnormal results are displayed) Labs Reviewed - No data to display  EKG None  Radiology No results found.  Procedures Procedures    Medications Ordered in ED Medications  acetaminophen (TYLENOL) tablet 1,000 mg (has no administration in time range)  ketorolac (TORADOL) 15 MG/ML injection 15 mg (has no administration in time range)  oxyCODONE (Oxy IR/ROXICODONE) immediate release tablet 5 mg (has no administration in time range)  diazepam (VALIUM) tablet  5 mg (has no administration in time range)    ED Course/ Medical Decision Making/ A&P                           Medical Decision Making Risk OTC drugs. Prescription drug management.   26 yo M with a chief complaints of left-sided low back pain.  Going on for couple days now.  He is well-appearing and nontoxic.  No red flags.  Benign exam.  Will treat supportively.  PCP follow-up.  10:53 PM:  I have discussed the diagnosis/risks/treatment options with the patient.  Evaluation and diagnostic testing in the emergency department does not suggest an emergent condition requiring admission or immediate intervention beyond what has been performed at this time.  They will follow up with PCP. We also discussed returning to the ED immediately if new or worsening sx occur. We discussed the sx which are most concerning (e.g., sudden worsening pain, fever, inability to tolerate by mouth, cauda  equina s/sx) that necessitate immediate return. Medications administered to the patient during their visit and any new prescriptions provided to the patient are listed below.  Medications given during this visit Medications  acetaminophen (TYLENOL) tablet 1,000 mg (has no administration in time range)  ketorolac (TORADOL) 15 MG/ML injection 15 mg (has no administration in time range)  oxyCODONE (Oxy IR/ROXICODONE) immediate release tablet 5 mg (has no administration in time range)  diazepam (VALIUM) tablet 5 mg (has no administration in time range)     The patient appears reasonably screen and/or stabilized for discharge and I doubt any other medical condition or other [facility] requiring further screening, evaluation, or treatment in the ED at this time prior to discharge.          Final Clinical Impression(s) / ED Diagnoses Final diagnoses:  Acute left-sided low back pain without sciatica    Rx / DC Orders ED Discharge Orders     None         Melene Plan, DO 03/03/22 2253

## 2022-03-03 NOTE — ED Triage Notes (Signed)
Pt is here for left lower back pain.  Pt reports that he injured this at work on Monday.  He has been treated at work for this but pain continues while working.  Pt needs work note.

## 2022-03-03 NOTE — Discharge Instructions (Signed)
Your back pain is most likely due to a muscular strain.  There is been a lot of research on back pain, unfortunately the only thing that seems to really help is Tylenol and ibuprofen.  Relative rest is also important to not lift greater than 10 pounds bending or twisting at the waist.  Please follow-up with your family physician.  The other thing that really seems to benefit patients is physical therapy which your doctor may send you for.  Please return to the emergency department for new numbness or weakness to your arms or legs. Difficulty with urinating or urinating or pooping on yourself.  Also if you cannot feel toilet paper when you wipe or get a fever.   Take 4 over the counter ibuprofen tablets 3 times a day or 2 over-the-counter naproxen tablets twice a day for pain. Also take tylenol 1000mg (2 extra strength) four times a day.   Stretches have helped me personally some you can try below 

## 2022-07-09 ENCOUNTER — Ambulatory Visit (HOSPITAL_BASED_OUTPATIENT_CLINIC_OR_DEPARTMENT_OTHER): Payer: BLUE CROSS/BLUE SHIELD | Admitting: Nurse Practitioner

## 2023-01-17 IMAGING — CR DG FOOT COMPLETE 3+V*L*
3 series · 3 of 3 positions shown · non-contrast
Comparison: None.

CLINICAL DATA: Ankle injury while playing basketball.

EXAM:
LEFT FOOT - COMPLETE 3+ VIEW

[t foot ap left]
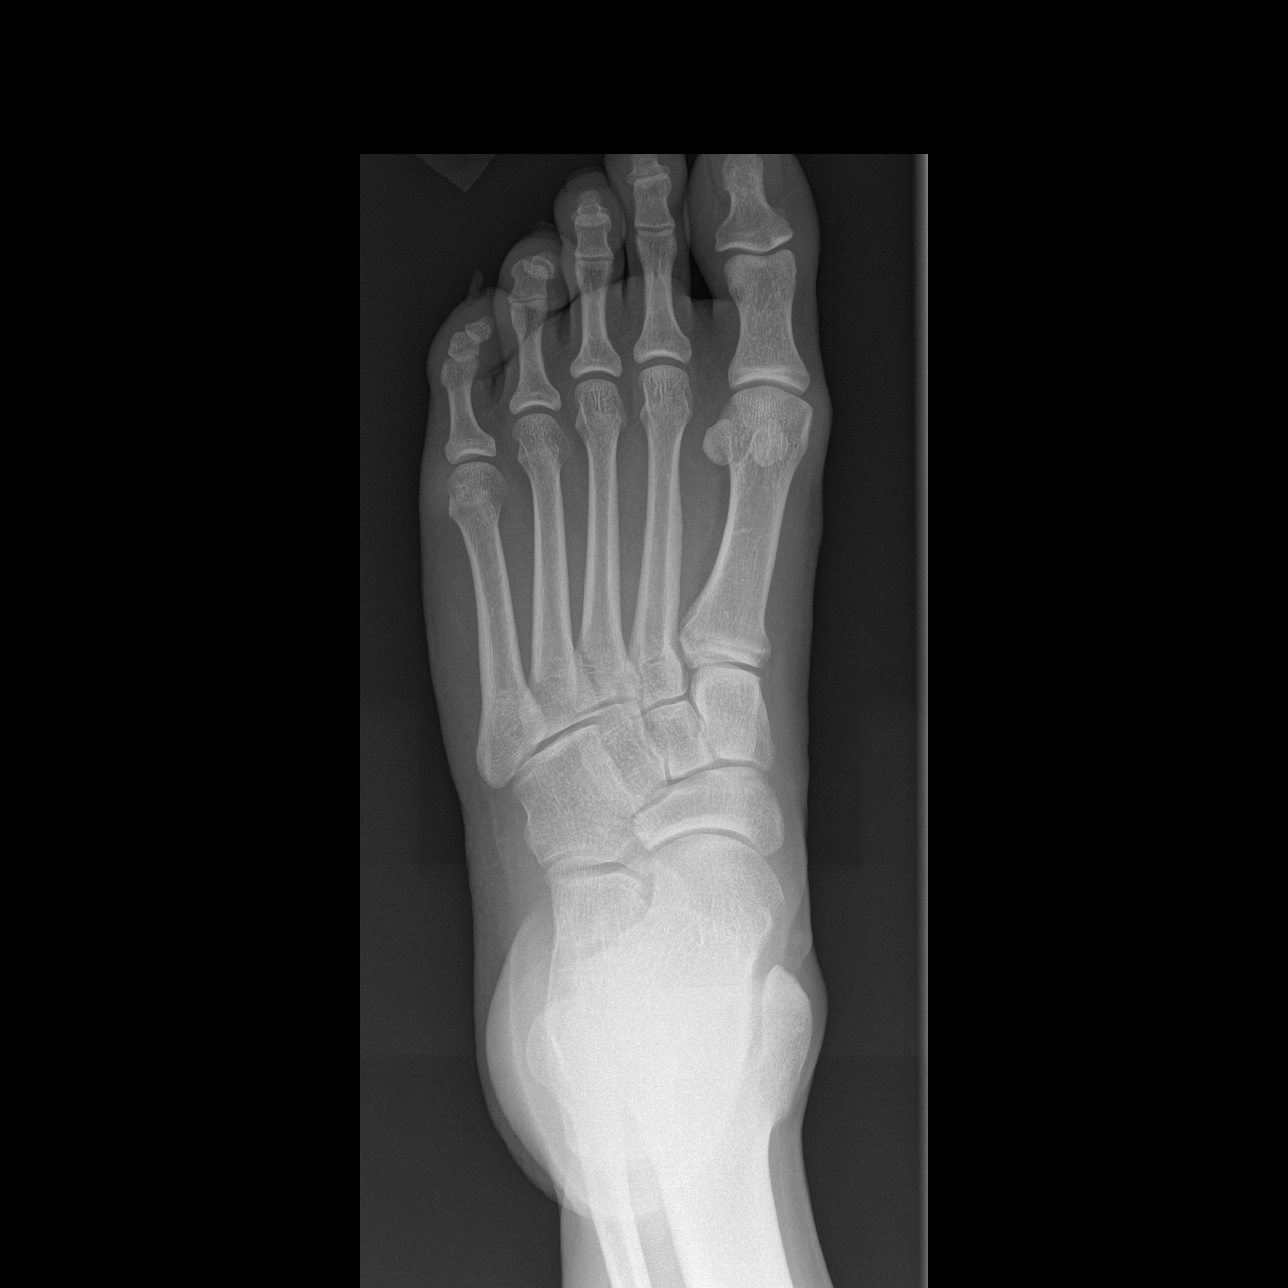

[t foot oblique left]
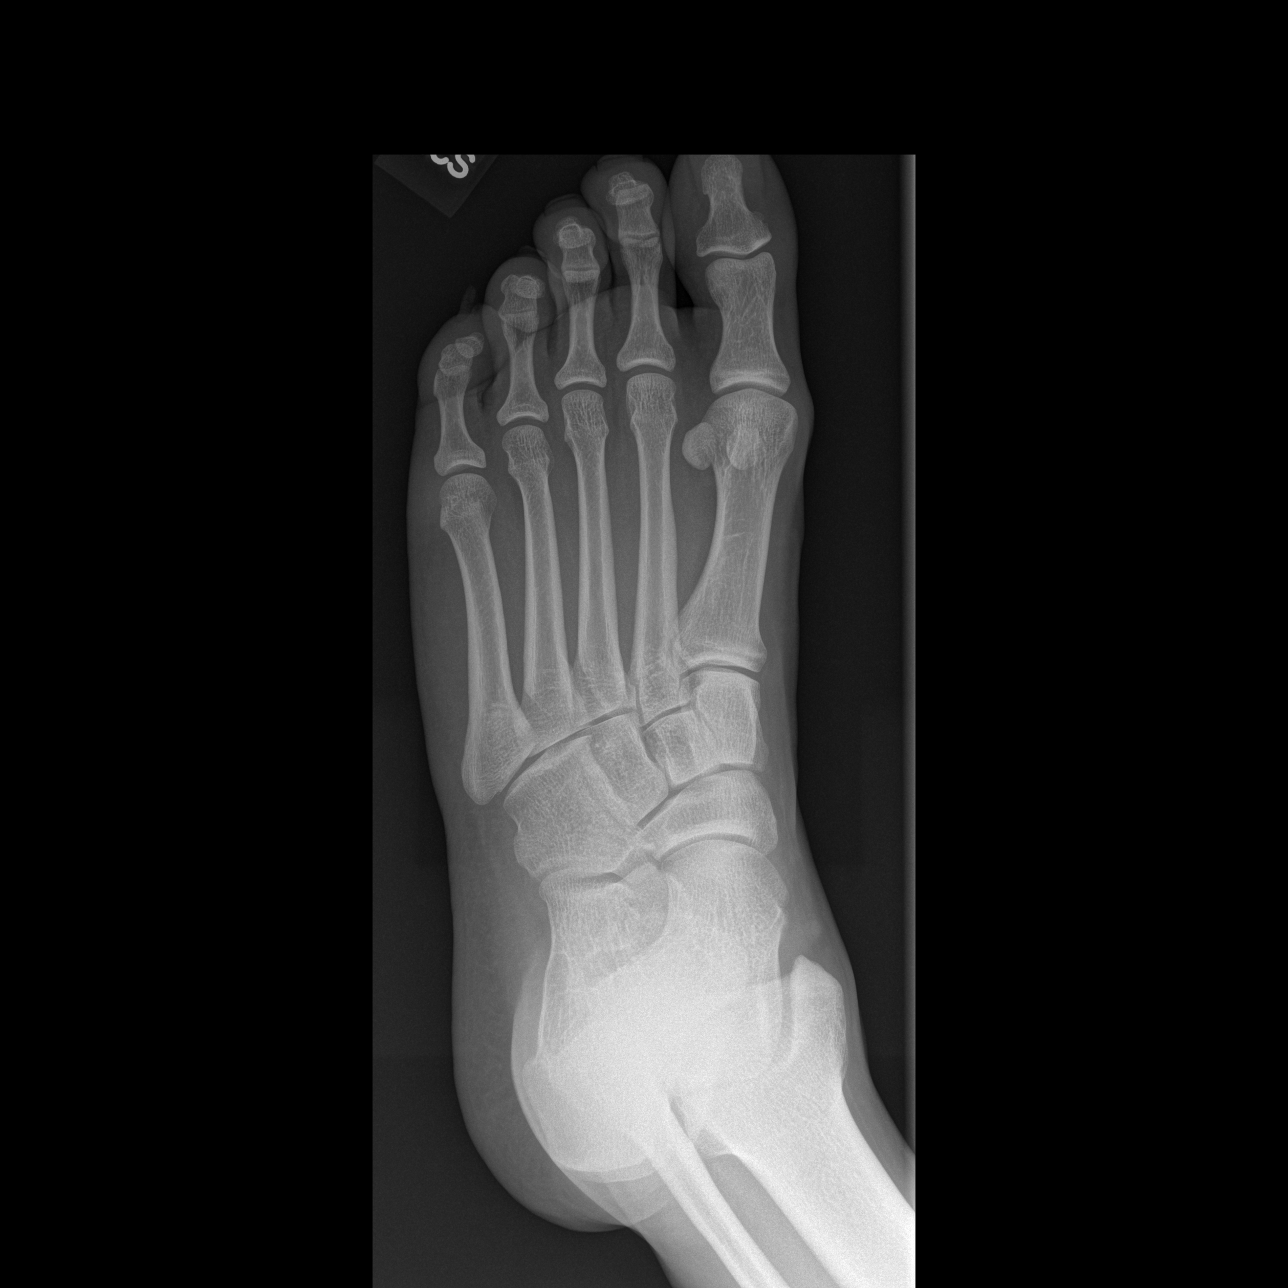

[t foot lat left]
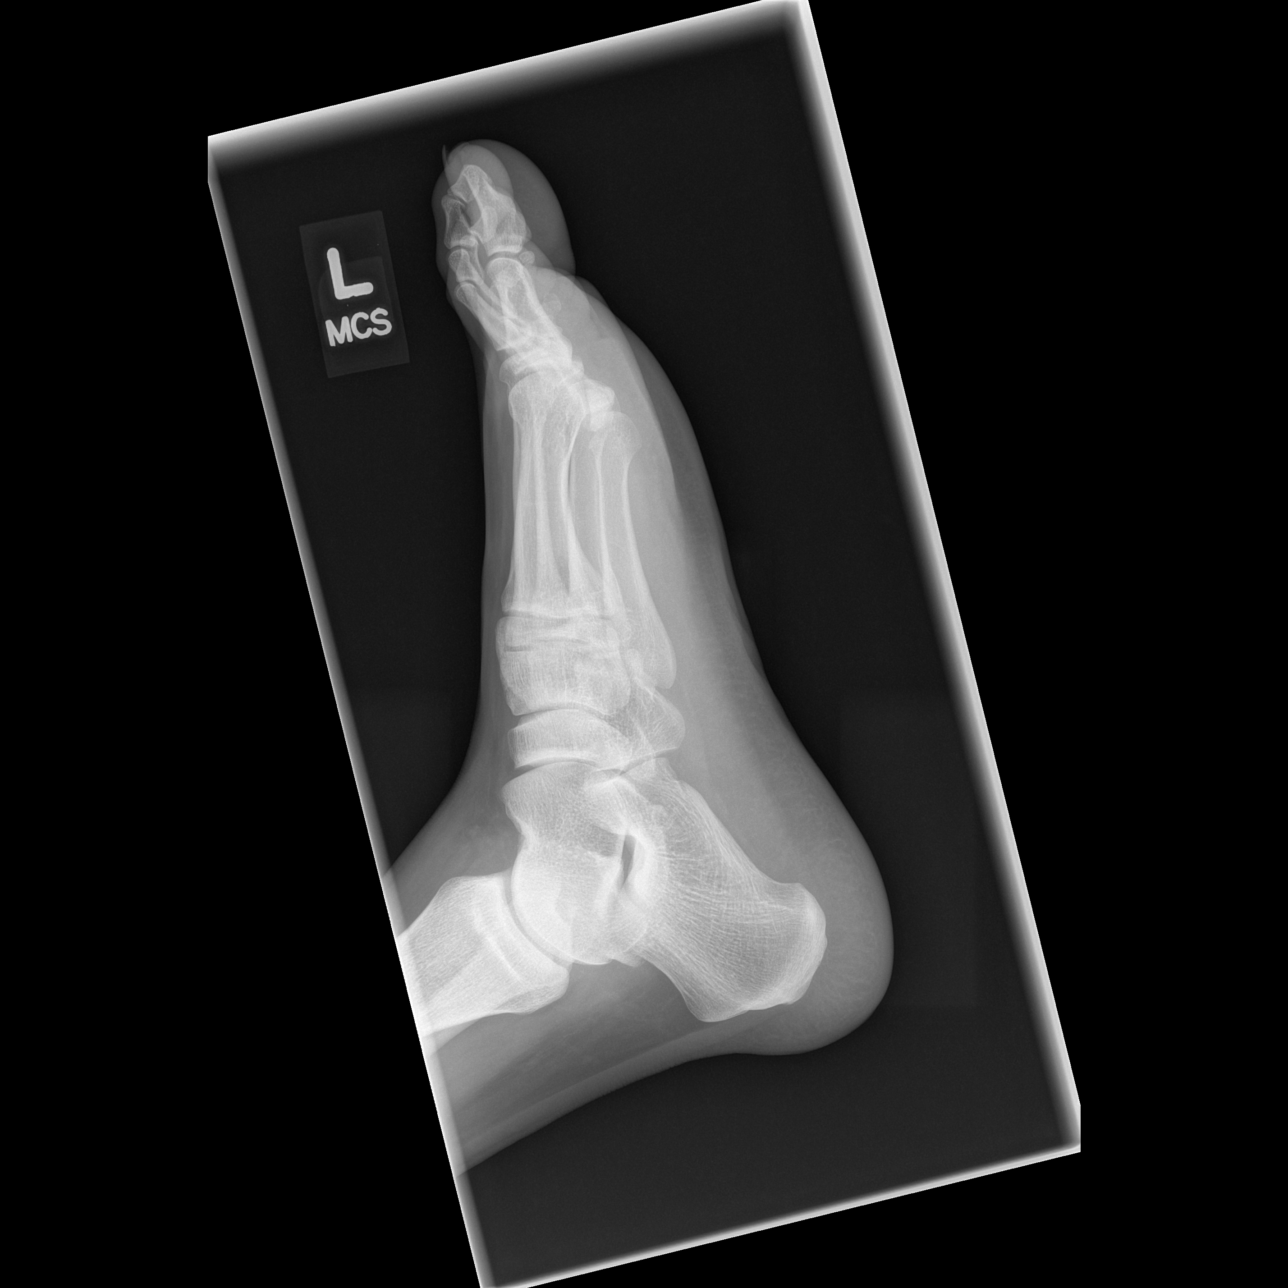

[3 of 3 positions shown; findings below may reference images not displayed]

FINDINGS: There is no evidence of fracture or dislocation. There is no
evidence of arthropathy or other focal bone abnormality. Soft
tissues are unremarkable.
IMPRESSION: Negative.

## 2023-02-27 ENCOUNTER — Ambulatory Visit: Payer: BLUE CROSS/BLUE SHIELD | Admitting: Family Medicine
